# Patient Record
Sex: Male | Born: 1975 | Race: Black or African American | Hispanic: No | Marital: Single | Smoking: Current every day smoker
Health system: Southern US, Community
[De-identification: ages and names within clinical notes are randomized; demographics above are authoritative.]

## PROBLEM LIST (undated history)

## (undated) DIAGNOSIS — I252 Old myocardial infarction: Secondary | ICD-10-CM

## (undated) DIAGNOSIS — W3400XA Accidental discharge from unspecified firearms or gun, initial encounter: Secondary | ICD-10-CM

---

## 2000-10-10 ENCOUNTER — Encounter: Payer: Self-pay | Admitting: Emergency Medicine

## 2000-10-10 ENCOUNTER — Emergency Department (HOSPITAL_COMMUNITY): Admission: EM | Admit: 2000-10-10 | Discharge: 2000-10-10 | Payer: Self-pay | Admitting: Emergency Medicine

## 2000-10-11 ENCOUNTER — Encounter: Payer: Self-pay | Admitting: Emergency Medicine

## 2011-08-02 ENCOUNTER — Emergency Department (HOSPITAL_COMMUNITY)
Admission: EM | Admit: 2011-08-02 | Discharge: 2011-08-02 | Disposition: A | Discharge status: Home / Self Care | Payer: No Typology Code available for payment source | Attending: Emergency Medicine | Admitting: Emergency Medicine

## 2011-08-02 ENCOUNTER — Emergency Department (HOSPITAL_COMMUNITY): Payer: No Typology Code available for payment source

## 2011-08-02 ENCOUNTER — Encounter (HOSPITAL_COMMUNITY): Payer: Self-pay | Admitting: Emergency Medicine

## 2011-08-02 DIAGNOSIS — F17200 Nicotine dependence, unspecified, uncomplicated: Secondary | ICD-10-CM | POA: Insufficient documentation

## 2011-08-02 DIAGNOSIS — I252 Old myocardial infarction: Secondary | ICD-10-CM | POA: Insufficient documentation

## 2011-08-02 DIAGNOSIS — M79609 Pain in unspecified limb: Secondary | ICD-10-CM | POA: Insufficient documentation

## 2011-08-02 DIAGNOSIS — M79601 Pain in right arm: Secondary | ICD-10-CM

## 2011-08-02 HISTORY — DX: Old myocardial infarction: I25.2

## 2011-08-02 MED ORDER — IBUPROFEN 800 MG PO TABS: 800.0000 mg | ORAL_TABLET | Freq: Three times a day (TID) | ORAL | Status: AC
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
## 2011-08-02 NOTE — ED Provider Notes (Signed)
Medical screening examination/treatment/procedure(s) were performed by non-physician practitioner and as supervising physician I was immediately available for consultation/collaboration.    Lendell Gallick R Laster Appling, MD 08/02/11 1611 

## 2014-12-07 ENCOUNTER — Emergency Department (HOSPITAL_COMMUNITY)
Admission: EM | Admit: 2014-12-07 | Discharge: 2014-12-07 | Disposition: A | Discharge status: Home / Self Care | Payer: No Typology Code available for payment source | Attending: Emergency Medicine | Admitting: Emergency Medicine

## 2014-12-07 ENCOUNTER — Emergency Department (HOSPITAL_COMMUNITY): Payer: No Typology Code available for payment source

## 2014-12-07 ENCOUNTER — Encounter (HOSPITAL_COMMUNITY): Payer: Self-pay

## 2014-12-07 DIAGNOSIS — S199XXA Unspecified injury of neck, initial encounter: Secondary | ICD-10-CM | POA: Diagnosis present

## 2014-12-07 DIAGNOSIS — Y9389 Activity, other specified: Secondary | ICD-10-CM | POA: Insufficient documentation

## 2014-12-07 DIAGNOSIS — Y92410 Unspecified street and highway as the place of occurrence of the external cause: Secondary | ICD-10-CM | POA: Insufficient documentation

## 2014-12-07 DIAGNOSIS — I252 Old myocardial infarction: Secondary | ICD-10-CM | POA: Diagnosis not present

## 2014-12-07 DIAGNOSIS — Y998 Other external cause status: Secondary | ICD-10-CM | POA: Insufficient documentation

## 2014-12-07 DIAGNOSIS — Z87828 Personal history of other (healed) physical injury and trauma: Secondary | ICD-10-CM | POA: Insufficient documentation

## 2014-12-07 DIAGNOSIS — S161XXA Strain of muscle, fascia and tendon at neck level, initial encounter: Secondary | ICD-10-CM | POA: Insufficient documentation

## 2014-12-07 DIAGNOSIS — F17210 Nicotine dependence, cigarettes, uncomplicated: Secondary | ICD-10-CM | POA: Diagnosis not present

## 2014-12-07 HISTORY — DX: Accidental discharge from unspecified firearms or gun, initial encounter: W34.00XA

## 2014-12-07 MED ORDER — NAPROXEN 500 MG PO TABS
500.0000 mg | ORAL_TABLET | Freq: Two times a day (BID) | ORAL | Status: AC
Start: 2014-12-07 — End: ?
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
## 2015-03-07 ENCOUNTER — Emergency Department (HOSPITAL_COMMUNITY)
Admission: EM | Admit: 2015-03-07 | Discharge: 2015-03-07 | Disposition: A | Discharge status: Home / Self Care | Payer: No Typology Code available for payment source | Attending: Emergency Medicine | Admitting: Emergency Medicine

## 2015-03-07 ENCOUNTER — Encounter (HOSPITAL_COMMUNITY): Payer: Self-pay | Admitting: Emergency Medicine

## 2015-03-07 DIAGNOSIS — R112 Nausea with vomiting, unspecified: Secondary | ICD-10-CM | POA: Insufficient documentation

## 2015-03-07 DIAGNOSIS — Z791 Long term (current) use of non-steroidal anti-inflammatories (NSAID): Secondary | ICD-10-CM | POA: Insufficient documentation

## 2015-03-07 DIAGNOSIS — Z87828 Personal history of other (healed) physical injury and trauma: Secondary | ICD-10-CM | POA: Insufficient documentation

## 2015-03-07 DIAGNOSIS — F17210 Nicotine dependence, cigarettes, uncomplicated: Secondary | ICD-10-CM | POA: Insufficient documentation

## 2015-03-07 DIAGNOSIS — I252 Old myocardial infarction: Secondary | ICD-10-CM | POA: Insufficient documentation

## 2015-03-07 DIAGNOSIS — R197 Diarrhea, unspecified: Secondary | ICD-10-CM | POA: Insufficient documentation

## 2015-03-07 LAB — URINALYSIS, ROUTINE W REFLEX MICROSCOPIC
BILIRUBIN URINE: NEGATIVE
Glucose, UA: NEGATIVE mg/dL
Hgb urine dipstick: NEGATIVE
Ketones, ur: NEGATIVE mg/dL
Leukocytes, UA: NEGATIVE
NITRITE: NEGATIVE
PH: 5.5 (ref 5.0–8.0)
Protein, ur: NEGATIVE mg/dL
SPECIFIC GRAVITY, URINE: 1.024 (ref 1.005–1.030)

## 2015-03-07 LAB — CBC
HEMATOCRIT: 47.8 % (ref 39.0–52.0)
HEMOGLOBIN: 15.8 g/dL (ref 13.0–17.0)
MCH: 31.5 pg (ref 26.0–34.0)
MCHC: 33.1 g/dL (ref 30.0–36.0)
MCV: 95.4 fL (ref 78.0–100.0)
Platelets: 184 10*3/uL (ref 150–400)
RBC: 5.01 MIL/uL (ref 4.22–5.81)
RDW: 13.2 % (ref 11.5–15.5)
WBC: 4.7 10*3/uL (ref 4.0–10.5)

## 2015-03-07 LAB — COMPREHENSIVE METABOLIC PANEL
ALBUMIN: 4.2 g/dL (ref 3.5–5.0)
ALK PHOS: 82 U/L (ref 38–126)
ALT: 26 U/L (ref 17–63)
AST: 33 U/L (ref 15–41)
Anion gap: 6 (ref 5–15)
BILIRUBIN TOTAL: 0.3 mg/dL (ref 0.3–1.2)
BUN: 10 mg/dL (ref 6–20)
CALCIUM: 9.2 mg/dL (ref 8.9–10.3)
CO2: 26 mmol/L (ref 22–32)
Chloride: 107 mmol/L (ref 101–111)
Creatinine, Ser: 1.08 mg/dL (ref 0.61–1.24)
GFR calc Af Amer: >60 mL/min (ref >60)
GFR calc non Af Amer: >60 mL/min (ref >60)
GLUCOSE: 90 mg/dL (ref 65–99)
POTASSIUM: 4 mmol/L (ref 3.5–5.1)
Sodium: 139 mmol/L (ref 135–145)
TOTAL PROTEIN: 7.5 g/dL (ref 6.5–8.1)

## 2015-03-07 LAB — LIPASE, BLOOD: Lipase: 42 U/L (ref 11–51)

## 2015-03-07 MED ORDER — ONDANSETRON HCL 4 MG/2ML IJ SOLN
4.0000 mg | Freq: Once | INTRAMUSCULAR | Status: DC
  Filled 2015-03-07: qty 2

## 2015-03-07 MED ORDER — SODIUM CHLORIDE 0.9 % IV BOLUS (SEPSIS): 1000.0000 mL | Freq: Once | INTRAVENOUS | Status: DC

## 2015-03-07 MED ORDER — PROMETHAZINE HCL 25 MG PO TABS: 25.0000 mg | ORAL_TABLET | Freq: Four times a day (QID) | ORAL | Status: AC | PRN

## 2015-03-07 MED ORDER — ONDANSETRON 8 MG PO TBDP
8.0000 mg | ORAL_TABLET | Freq: Once | ORAL | Status: AC
  Administered 2015-03-07: 8 mg via ORAL
  Filled 2015-03-07: qty 1

## 2015-03-07 NOTE — ED Notes (Signed)
Pt complaint of n/v/d with lower back pain onset last night.

## 2015-03-07 NOTE — ED Notes (Signed)
Awake. Verbally responsive. A/O x4. Resp even and unlabored. No audible adventitious breath sounds noted. ABC's intact.  
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
## 2015-03-07 NOTE — ED Notes (Addendum)
Awake. Verbally responsive. Resp even and unlabored. No audible adventitious breath sounds noted. ABC's intact. Generalized abd soft/nondistended but tender to palpate. BS (+) and active x4 quadrants. N/V x4 and diarrhea x9 in past 24hrs. LBM was yesterday.

## 2015-03-07 NOTE — ED Notes (Signed)
Pt given Ginger Ale to drink. Will  Continue to monitor.
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
